# Patient Record
Sex: Male | Born: 1975 | Race: White | Hispanic: No | Marital: Single | Smoking: Current every day smoker
Health system: Southern US, Community
[De-identification: ages and names within clinical notes are randomized; demographics above are authoritative.]

## PROBLEM LIST (undated history)

## (undated) HISTORY — PX: WRIST FRACTURE SURGERY: SHX121

---

## 2001-12-03 ENCOUNTER — Emergency Department (HOSPITAL_COMMUNITY): Admission: EM | Admit: 2001-12-03 | Discharge: 2001-12-03 | Payer: Self-pay | Admitting: *Deleted

## 2003-09-29 ENCOUNTER — Emergency Department (HOSPITAL_COMMUNITY): Admission: EM | Admit: 2003-09-29 | Discharge: 2003-09-29 | Payer: Self-pay | Admitting: Emergency Medicine

## 2005-06-16 ENCOUNTER — Ambulatory Visit (HOSPITAL_COMMUNITY): Admission: RE | Admit: 2005-06-16 | Discharge: 2005-06-16 | Payer: Self-pay | Admitting: Family Medicine

## 2005-11-15 ENCOUNTER — Ambulatory Visit (HOSPITAL_COMMUNITY): Admission: RE | Admit: 2005-11-15 | Discharge: 2005-11-15 | Payer: Self-pay | Admitting: Family Medicine

## 2012-08-21 ENCOUNTER — Other Ambulatory Visit (HOSPITAL_COMMUNITY): Payer: Self-pay | Admitting: Internal Medicine

## 2012-08-21 ENCOUNTER — Ambulatory Visit (HOSPITAL_COMMUNITY)
Admission: RE | Admit: 2012-08-21 | Discharge: 2012-08-21 | Disposition: A | Discharge status: Home / Self Care | Payer: BC Managed Care – PPO | Source: Ambulatory Visit | Attending: Internal Medicine | Admitting: Internal Medicine

## 2012-08-21 DIAGNOSIS — M25579 Pain in unspecified ankle and joints of unspecified foot: Secondary | ICD-10-CM | POA: Insufficient documentation

## 2012-08-21 DIAGNOSIS — M25571 Pain in right ankle and joints of right foot: Secondary | ICD-10-CM

## 2012-08-30 ENCOUNTER — Ambulatory Visit (HOSPITAL_COMMUNITY)
Admission: RE | Admit: 2012-08-30 | Discharge: 2012-08-30 | Disposition: A | Discharge status: Home / Self Care | Payer: BC Managed Care – PPO | Source: Ambulatory Visit | Attending: Internal Medicine | Admitting: Internal Medicine

## 2012-08-30 ENCOUNTER — Other Ambulatory Visit (HOSPITAL_COMMUNITY): Payer: Self-pay | Admitting: Internal Medicine

## 2012-08-30 DIAGNOSIS — R209 Unspecified disturbances of skin sensation: Secondary | ICD-10-CM | POA: Insufficient documentation

## 2012-08-30 DIAGNOSIS — R0789 Other chest pain: Secondary | ICD-10-CM

## 2012-08-30 DIAGNOSIS — R222 Localized swelling, mass and lump, trunk: Secondary | ICD-10-CM | POA: Insufficient documentation

## 2017-06-10 ENCOUNTER — Other Ambulatory Visit: Payer: Self-pay

## 2017-06-10 ENCOUNTER — Emergency Department (HOSPITAL_COMMUNITY)
Admission: EM | Admit: 2017-06-10 | Discharge: 2017-06-11 | Disposition: A | Discharge status: Home / Self Care | Payer: Managed Care, Other (non HMO) | Attending: Emergency Medicine | Admitting: Emergency Medicine

## 2017-06-10 ENCOUNTER — Encounter (HOSPITAL_COMMUNITY): Payer: Self-pay | Admitting: Emergency Medicine

## 2017-06-10 DIAGNOSIS — N201 Calculus of ureter: Secondary | ICD-10-CM | POA: Diagnosis not present

## 2017-06-10 DIAGNOSIS — F17210 Nicotine dependence, cigarettes, uncomplicated: Secondary | ICD-10-CM | POA: Diagnosis not present

## 2017-06-10 DIAGNOSIS — N200 Calculus of kidney: Secondary | ICD-10-CM

## 2017-06-10 DIAGNOSIS — N23 Unspecified renal colic: Secondary | ICD-10-CM

## 2017-06-10 DIAGNOSIS — R112 Nausea with vomiting, unspecified: Secondary | ICD-10-CM | POA: Insufficient documentation

## 2017-06-10 DIAGNOSIS — R1032 Left lower quadrant pain: Secondary | ICD-10-CM | POA: Diagnosis present

## 2017-06-10 LAB — URINALYSIS, ROUTINE W REFLEX MICROSCOPIC
BILIRUBIN URINE: NEGATIVE
Bacteria, UA: NONE SEEN
GLUCOSE, UA: NEGATIVE mg/dL
KETONES UR: 20 mg/dL — AB
LEUKOCYTES UA: NEGATIVE
NITRITE: NEGATIVE
PH: 8 (ref 5.0–8.0)
Protein, ur: 30 mg/dL — AB
SQUAMOUS EPITHELIAL / LPF: NONE SEEN
Specific Gravity, Urine: 1.019 (ref 1.005–1.030)

## 2017-06-10 LAB — COMPREHENSIVE METABOLIC PANEL
ALT: 16 U/L — AB (ref 17–63)
AST: 20 U/L (ref 15–41)
Albumin: 4.2 g/dL (ref 3.5–5.0)
Alkaline Phosphatase: 76 U/L (ref 38–126)
Anion gap: 11 (ref 5–15)
BUN: 10 mg/dL (ref 6–20)
CHLORIDE: 100 mmol/L — AB (ref 101–111)
CO2: 26 mmol/L (ref 22–32)
CREATININE: 1.18 mg/dL (ref 0.61–1.24)
Calcium: 9.6 mg/dL (ref 8.9–10.3)
Glucose, Bld: 119 mg/dL — ABNORMAL HIGH (ref 65–99)
Potassium: 3.3 mmol/L — ABNORMAL LOW (ref 3.5–5.1)
Sodium: 137 mmol/L (ref 135–145)
Total Bilirubin: 0.8 mg/dL (ref 0.3–1.2)
Total Protein: 7.8 g/dL (ref 6.5–8.1)

## 2017-06-10 LAB — CBC
HCT: 46.8 % (ref 39.0–52.0)
Hemoglobin: 15.5 g/dL (ref 13.0–17.0)
MCH: 31 pg (ref 26.0–34.0)
MCHC: 33.1 g/dL (ref 30.0–36.0)
MCV: 93.6 fL (ref 78.0–100.0)
Platelets: 226 10*3/uL (ref 150–400)
RBC: 5 MIL/uL (ref 4.22–5.81)
RDW: 12.4 % (ref 11.5–15.5)
WBC: 16.1 10*3/uL — AB (ref 4.0–10.5)

## 2017-06-10 LAB — LIPASE, BLOOD: LIPASE: 26 U/L (ref 11–51)

## 2017-06-10 MED ORDER — ONDANSETRON 4 MG PO TBDP
4.0000 mg | ORAL_TABLET | Freq: Once | ORAL | Status: AC | PRN
  Administered 2017-06-10: 4 mg via ORAL
  Filled 2017-06-10: qty 1

## 2017-06-10 NOTE — ED Triage Notes (Addendum)
PT c/o left flank pain with nausea and vomiting for the past 2 days with chills/sweats. PT also states his daughter had same symptoms this week and was dx with stomach virus.

## 2017-06-11 ENCOUNTER — Emergency Department (HOSPITAL_COMMUNITY): Payer: Managed Care, Other (non HMO)

## 2017-06-11 MED ORDER — MORPHINE SULFATE (PF) 4 MG/ML IV SOLN
4.0000 mg | Freq: Once | INTRAVENOUS | Status: AC
  Administered 2017-06-11: 4 mg via INTRAVENOUS
  Filled 2017-06-11: qty 1

## 2017-06-11 MED ORDER — ONDANSETRON HCL 4 MG/2ML IJ SOLN
4.0000 mg | Freq: Once | INTRAMUSCULAR | Status: AC
  Administered 2017-06-11: 4 mg via INTRAVENOUS
  Filled 2017-06-11: qty 2

## 2017-06-11 MED ORDER — ONDANSETRON HCL 4 MG PO TABS: 4.0000 mg | ORAL_TABLET | Freq: Three times a day (TID) | ORAL | 0 refills | Status: AC | PRN

## 2017-06-11 MED ORDER — KETOROLAC TROMETHAMINE 30 MG/ML IJ SOLN
30.0000 mg | Freq: Once | INTRAMUSCULAR | Status: AC
  Administered 2017-06-11: 30 mg via INTRAVENOUS
  Filled 2017-06-11: qty 1

## 2017-06-11 MED ORDER — SODIUM CHLORIDE 0.9 % IV BOLUS (SEPSIS)
1000.0000 mL | Freq: Once | INTRAVENOUS | Status: AC
Start: 2017-06-11 — End: 2017-06-11
  Administered 2017-06-11: 1000 mL via INTRAVENOUS

## 2017-06-11 MED ORDER — TAMSULOSIN HCL 0.4 MG PO CAPS: 0.4000 mg | ORAL_CAPSULE | Freq: Every day | ORAL | 0 refills | Status: AC

## 2017-06-11 MED ORDER — OXYCODONE-ACETAMINOPHEN 5-325 MG PO TABS: 1.0000 | ORAL_TABLET | ORAL | 0 refills | Status: AC | PRN

## 2017-06-11 MED ORDER — ONDANSETRON HCL 4 MG PO TABS: 4.0000 mg | ORAL_TABLET | Freq: Four times a day (QID) | ORAL | 0 refills | Status: AC | PRN

## 2017-06-11 NOTE — ED Provider Notes (Signed)
[facility] EMERGENCY DEPARTMENT Provider Note   CSN: [id number] Arrival date & time: 06/10/17  1810     History   Chief Complaint Chief Complaint  Patient presents with  . Flank Pain    HPI [place] E Mathurin is a 42 y.o. male.  The history is provided by the patient.  He comes in with sharp pain in the left flank radiating to the left lower abdomen which started 2 days ago.  Pain is severe and he rates it at 10/10.  Nothing makes it better, nothing makes it worse.  There is associated nausea and vomiting.  He has had some chills and sweats but no fever.  He denies any urinary difficulty and denies any constipation or diarrhea.  He has not taken anything for this at home.  He has never had similar symptoms before.  History reviewed. No pertinent past medical history.  There are no active problems to display for this patient.   Past Surgical History:  Procedure Laterality Date  . WRIST FRACTURE SURGERY         Home Medications    Prior to Admission medications   Not on File    Family History History reviewed. No pertinent family history.  Social History Social History   Tobacco Use  . Smoking status: Current Every Day Smoker    Packs/day: 2.00    Types: Cigarettes  . Smokeless tobacco: Never Used  Substance Use Topics  . Alcohol use: No    Frequency: Never  . Drug use: No     Allergies   Patient has no known allergies.   Review of Systems Review of Systems  All other systems reviewed and are negative.    Physical Exam Updated Vital Signs BP 110/76 (BP Location: Right Arm)   Pulse 82   Temp 97.9 F (36.6 C)   Resp 20   Ht 6' (1.829 m)   Wt 68 kg (150 lb)   SpO2 100%   BMI 20.34 kg/m   Physical Exam  Nursing note and vitals reviewed.  42 year old male, appears very uncomfortable and in pain, but is in no acute distress. Vital signs are normal. Oxygen saturation is 100%, which is normal. Head is normocephalic and atraumatic. PERRLA, EOMI.  Oropharynx is clear. Neck is nontender and supple without adenopathy or JVD. Back is nontender in the midline.  There is mild left CVA tenderness. Lungs are clear without rales, wheezes, or rhonchi. Chest is nontender. Heart has regular rate and rhythm without murmur. Abdomen is soft, flat, with mild left lower quadrant tenderness.  There is no rebound or guarding.  There are no masses or hepatosplenomegaly and peristalsis is hypoactive. Extremities have no cyanosis or edema, full range of motion is present. Skin is warm and dry without rash. Neurologic: Mental status is normal, cranial nerves are intact, there are no motor or sensory deficits.  ED Treatments / Results  Labs (all labs ordered are listed, but only abnormal results are displayed) Labs Reviewed  COMPREHENSIVE METABOLIC PANEL - Abnormal; Notable for the following components:      Result Value   Potassium 3.3 (*)    Chloride 100 (*)    Glucose, Bld 119 (*)    ALT 16 (*)    All other components within normal limits  CBC - Abnormal; Notable for the following components:   WBC 16.1 (*)    All other components within normal limits  URINALYSIS, ROUTINE W REFLEX MICROSCOPIC - Abnormal; Notable for the  following components:   APPearance HAZY (*)    Hgb urine dipstick MODERATE (*)    Ketones, ur 20 (*)    Protein, ur 30 (*)    All other components within normal limits  LIPASE, BLOOD   Radiology Ct Renal Stone Study  Result Date: 06/11/2017 CLINICAL DATA:  42 y/o M; sharp intermittent left flank pain with nausea and vomiting. EXAM: CT ABDOMEN AND PELVIS WITHOUT CONTRAST TECHNIQUE: Multidetector CT imaging of the abdomen and pelvis was performed following the standard protocol without IV contrast. COMPARISON:  09/29/2003 CT of abdomen and pelvis FINDINGS: Lower chest: Bilateral subpleural 4-5 mm nodules, likely intrapulmonary lymph nodes. Hepatobiliary: No focal liver abnormality is seen. No gallstones, gallbladder wall  thickening, or biliary dilatation. Pancreas: Unremarkable. No pancreatic ductal dilatation or surrounding inflammatory changes. Spleen: Normal in size without focal abnormality. Adrenals/Urinary Tract: Normal adrenal glands. Punctate left kidney interpolar and 4 mm right kidney interpolar nonobstructing stones. No focal kidney lesion identified. No right hydronephrosis. Mild left hydronephrosis with 1 mm stones in distal left ureter just proximal to the ureterovesicular junction. Collapsed bladder. Stomach/Bowel: Stomach is within normal limits. Appendix appears normal. No evidence of bowel wall thickening, distention, or inflammatory changes. Vascular/Lymphatic: No significant vascular findings are present. No enlarged abdominal or pelvic lymph nodes. Reproductive: Central prostatic calcifications. Other: No abdominal wall hernia or abnormality. No abdominopelvic ascites. Musculoskeletal: No acute or significant osseous findings. IMPRESSION: Mild left hydronephrosis with 4 mm stone in distal left ureter just proximal to the ureterovesicular junction. Electronically Signed   By: Mitzi Hansen M.D.   On: 06/11/2017 02:19    Procedures Procedures (including critical care time)  Medications Ordered in ED Medications  sodium chloride 0.9 % bolus 1,000 mL (not administered)  ketorolac (TORADOL) 30 MG/ML injection 30 mg (not administered)  morphine 4 MG/ML injection 4 mg (not administered)  ondansetron (ZOFRAN) injection 4 mg (not administered)  ondansetron (ZOFRAN-ODT) disintegrating tablet 4 mg (4 mg Oral Given 06/10/17 1830)     Initial Impression / Assessment and Plan / ED Course  I have reviewed the triage vital signs and the nursing notes.  Pertinent labs & imaging results that were available during my care of the patient were reviewed by me and considered in my medical decision making (see chart for details).  Severe left flank pain strongly suggestive of ureterolithiasis.  Consider  diverticulitis, urinary tract infection.  Urinalysis does show too numerous to count RBCs, consistent with urolithiasis.  Doubt abdominal aneurysm given lack of significant history.  He will be given IV fluids, ketorolac, morphine, ondansetron and will be sent for CT renal stone protocol.  Old records are reviewed, and he did have a negative CT of abdomen and pelvis in 2005.  CT scan shows a 4 mm distal ureteral calculus.  He had excellent relief of symptoms with above-noted treatment.  He is discharged with prescriptions for oxycodone-acetaminophen, ondansetron, tamsulosin.  Advised to use ibuprofen or naproxen as needed.  Referred to urology for follow-up.  Return precautions discussed.  Final Clinical Impressions(s) / ED Diagnoses   Final diagnoses:  Ureterolithiasis  Ureteral colic    ED Discharge Orders        Ordered    oxyCODONE-acetaminophen (PERCOCET) 5-325 MG tablet  Every 4 hours PRN     06/11/17 0305    ondansetron (ZOFRAN) 4 MG tablet  Every 8 hours PRN     06/11/17 0305    oxyCODONE-acetaminophen (PERCOCET) 5-325 MG tablet  Every 4 hours PRN  06/11/17 0306    ondansetron (ZOFRAN) 4 MG tablet  Every 6 hours PRN     06/11/17 0306    tamsulosin (FLOMAX) 0.4 MG CAPS capsule  Daily     06/11/17 0306       Dione BoozeGlick, Sharanya Templin, MD 06/11/17 [phone]

## 2017-06-11 NOTE — ED Notes (Signed)
Family irate that no doctor has seen pt yet and that we had to move pt in hallway d/t emergency pt that was coming in via EMS.  Explained delay to pt and family but they remain very unhappy. Notified house Supervisor Gina,RN of this and asked family to speak with pt and family.

## 2017-06-11 NOTE — Discharge Instructions (Signed)
Drink plenty of fluids.  Return if pain is not being adequately controlled, or if you start running a fever. 

## 2017-06-12 MED FILL — Oxycodone w/ Acetaminophen Tab 5-325 MG: ORAL | Qty: 6 | Status: AC

## 2017-06-12 MED FILL — Ondansetron HCl Tab 4 MG: ORAL | Qty: 4 | Status: AC

## 2019-04-24 ENCOUNTER — Other Ambulatory Visit: Payer: Self-pay

## 2019-04-24 DIAGNOSIS — Z20822 Contact with and (suspected) exposure to covid-19: Secondary | ICD-10-CM

## 2019-04-27 LAB — NOVEL CORONAVIRUS, NAA: SARS-CoV-2, NAA: NOT DETECTED

## 2019-04-28 ENCOUNTER — Telehealth: Payer: Self-pay

## 2019-04-28 NOTE — Telephone Encounter (Signed)
Patient given negative result and verbalized understanding  

## 2019-09-25 IMAGING — CT CT RENAL STONE PROTOCOL
2 of 4 series · 16 of 46 positions shown, 18 images · non-contrast
Comparison: 09/29/2003 CT of abdomen and pelvis

CLINICAL DATA: 41 y/o M; sharp intermittent left flank pain with
nausea and vomiting.

EXAM:
CT ABDOMEN AND PELVIS WITHOUT CONTRAST
TECHNIQUE: Multidetector CT imaging of the abdomen and pelvis was performed
following the standard protocol without IV contrast.

[Series 2: axial st · axial · 0.64mm/px · z∈[-393,+47]mm · 13 of 96 slices shown, 15 images]
[im 4/96  soft-tissue]
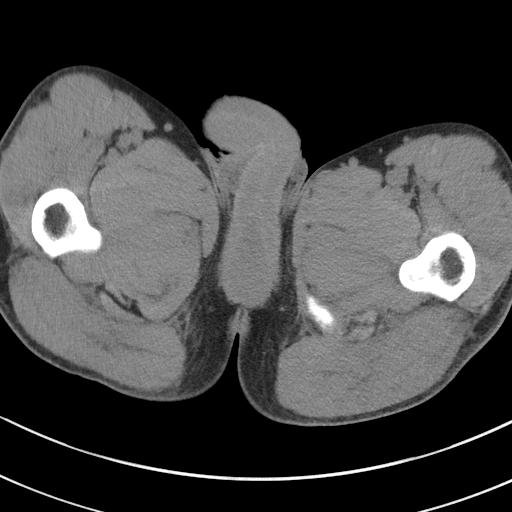
[im 4/96  bone]
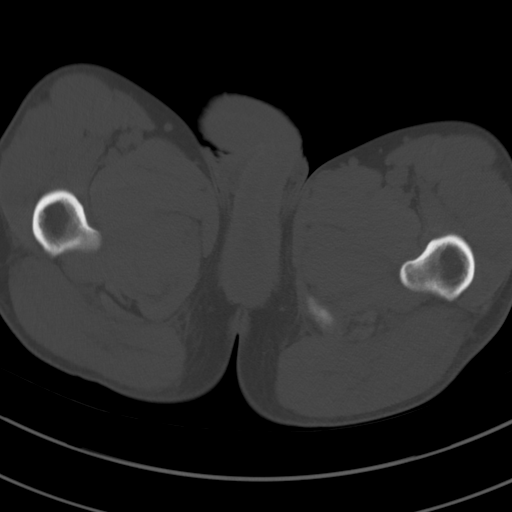
[im 12/96  soft-tissue]
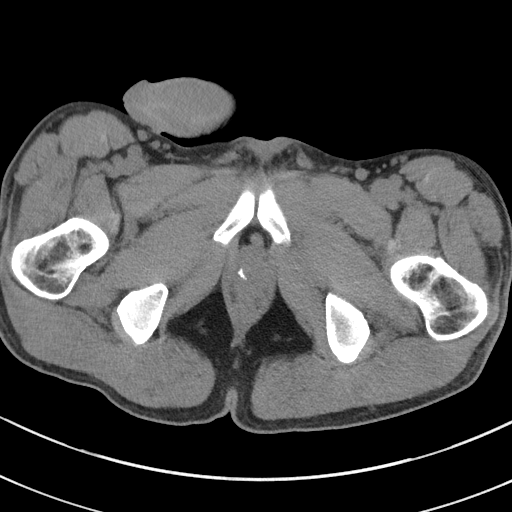
[im 20/96  soft-tissue]
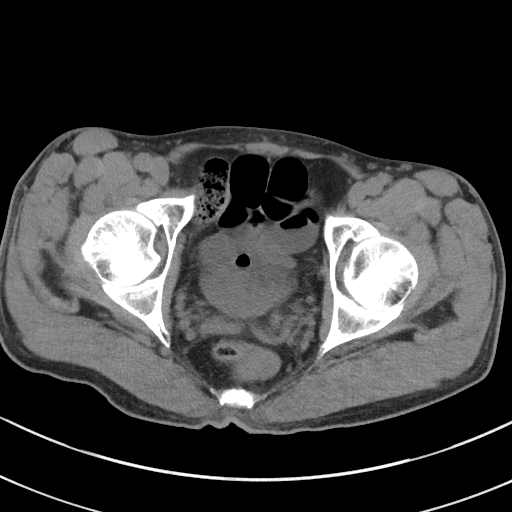
[im 27/96  soft-tissue]
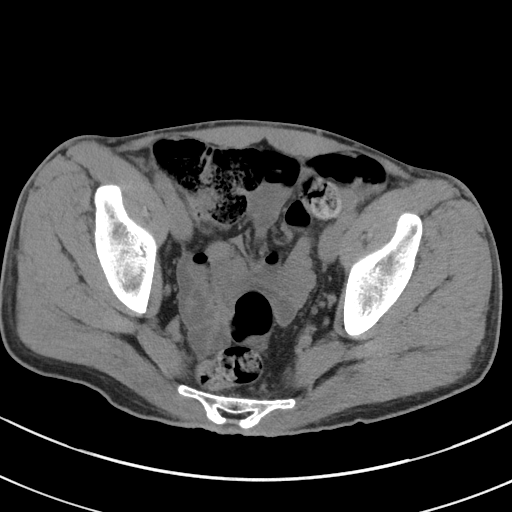
[im 35/96  soft-tissue]
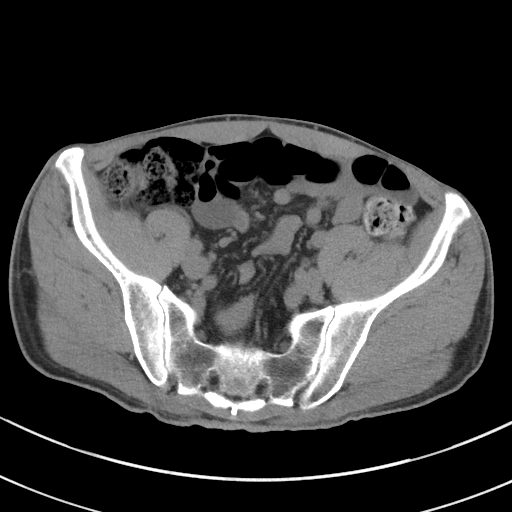
[im 42/96  soft-tissue]
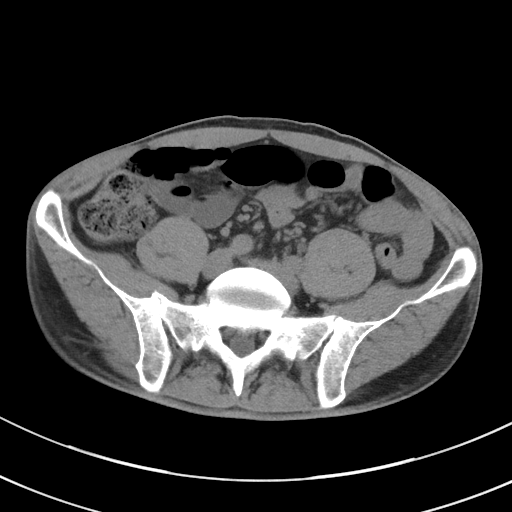
[im 50/96  soft-tissue]
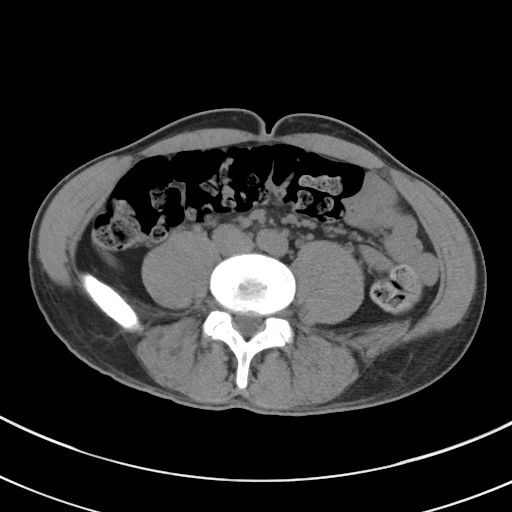
[im 54/96  soft-tissue]
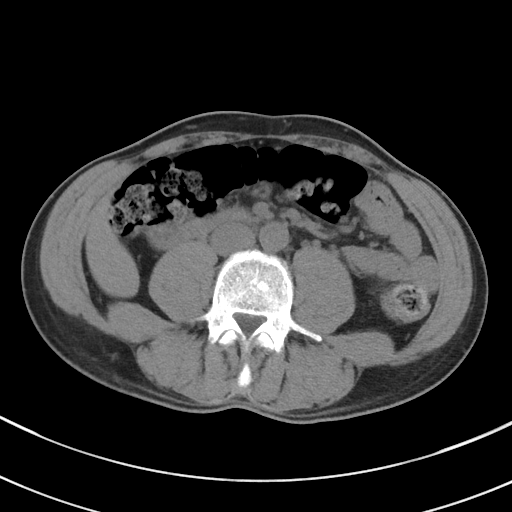
[im 61/96  soft-tissue]
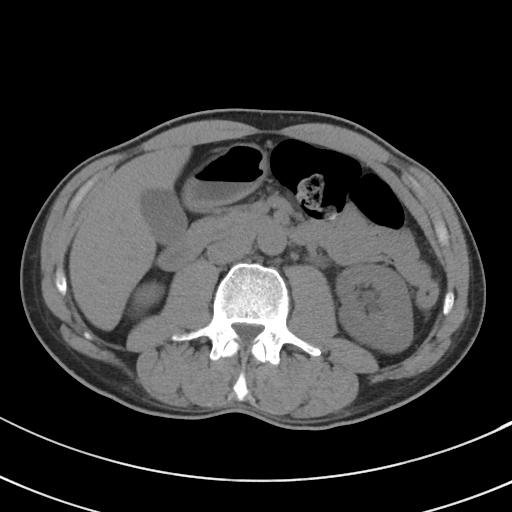
[im 61/96  bone]
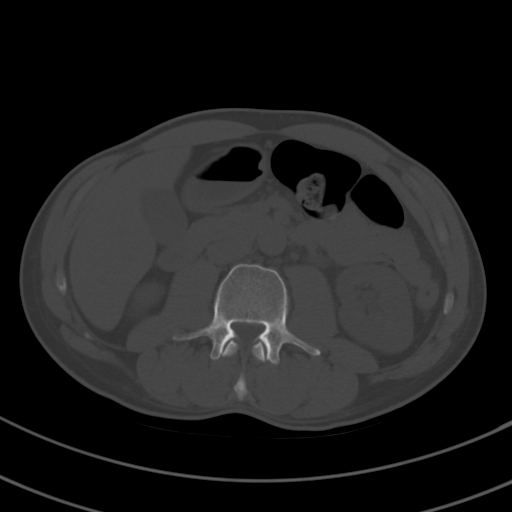
[im 69/96  soft-tissue]
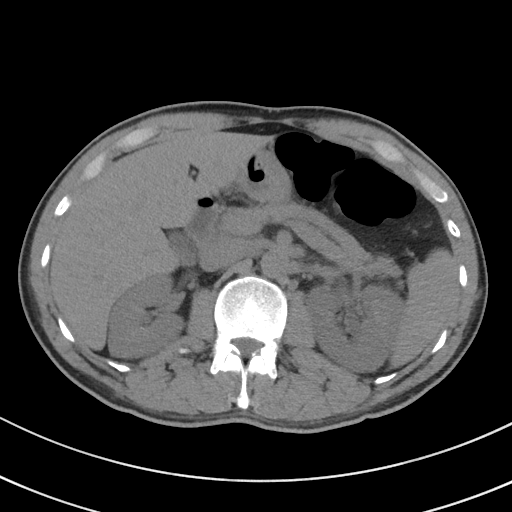
[im 77/96  soft-tissue]
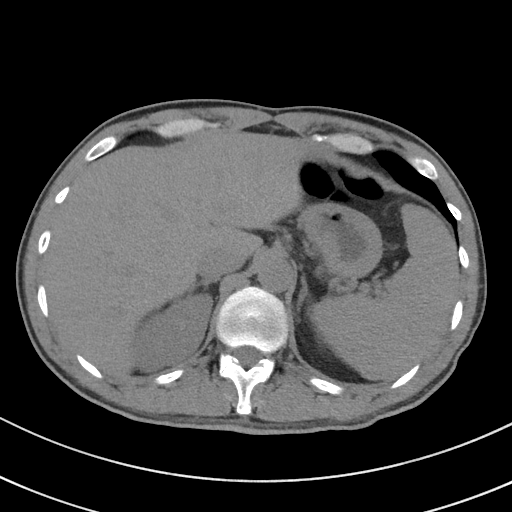
[im 84/96  soft-tissue]
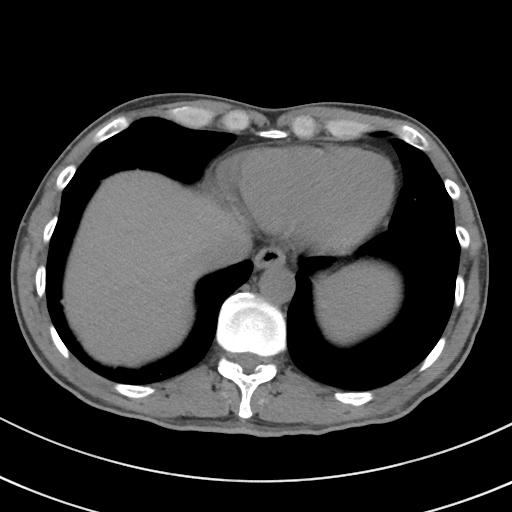
[im 92/96  soft-tissue]
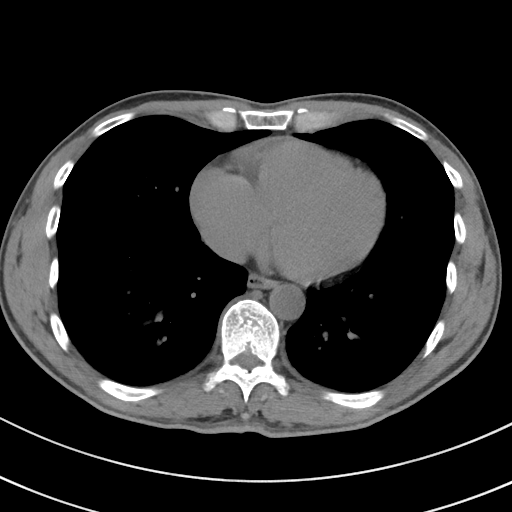

[Series 5: coronal st · coronal · 0.73mm/px · 3 of 91 slices shown]
[im 31/91  soft-tissue]
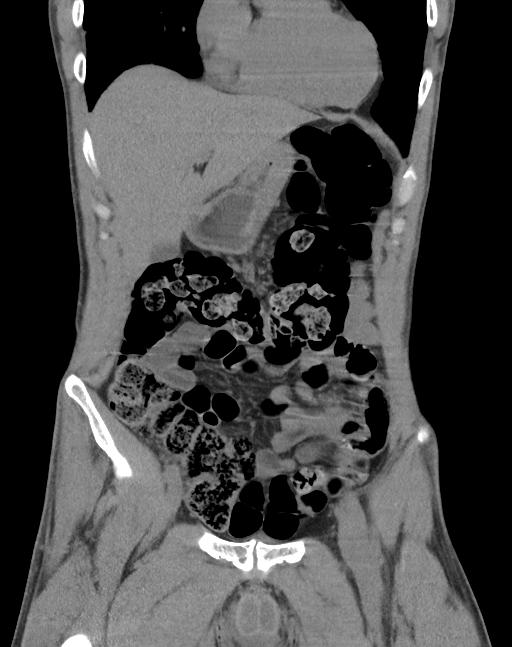
[im 41/91  soft-tissue]
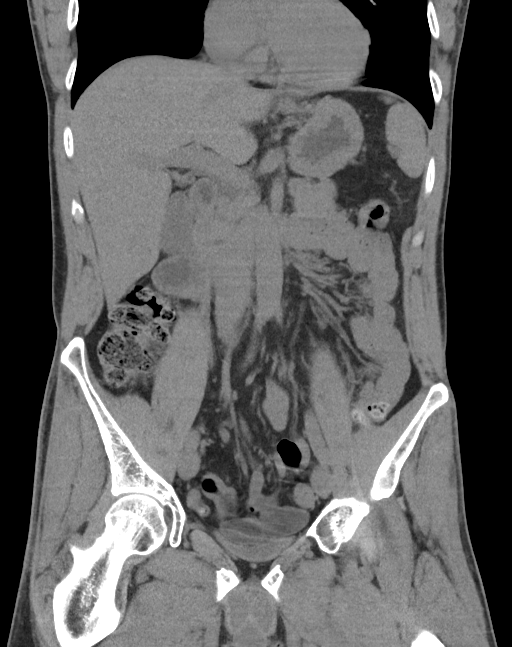
[im 51/91  soft-tissue]
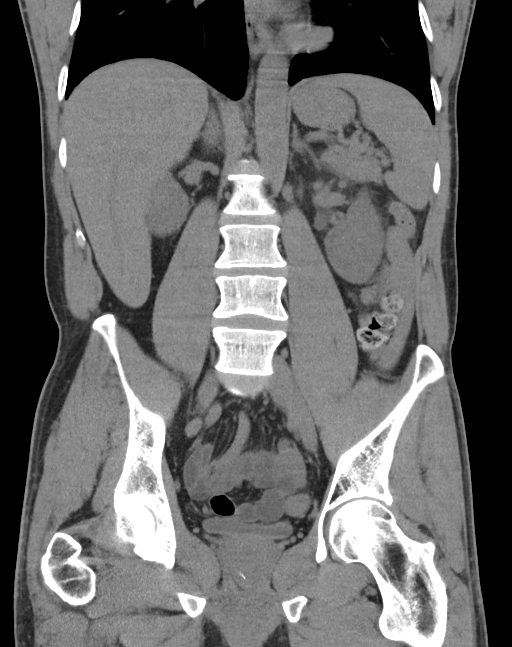

[16 of 46 positions shown; findings below may reference images not displayed]

FINDINGS: Lower chest: Bilateral subpleural 4-5 mm nodules, likely
intrapulmonary lymph nodes.

Hepatobiliary: No focal liver abnormality is seen. No gallstones,
gallbladder wall thickening, or biliary dilatation.

Pancreas: Unremarkable. No pancreatic ductal dilatation or
surrounding inflammatory changes.

Spleen: Normal in size without focal abnormality.

Adrenals/Urinary Tract: Normal adrenal glands. Punctate left kidney
interpolar and 4 mm right kidney interpolar nonobstructing stones.
No focal kidney lesion identified. No right hydronephrosis. Mild
left hydronephrosis with 1 mm stones in distal left ureter just
proximal to the ureterovesicular junction. Collapsed bladder.

Stomach/Bowel: Stomach is within normal limits. Appendix appears
normal. No evidence of bowel wall thickening, distention, or
inflammatory changes.

Vascular/Lymphatic: No significant vascular findings are present. No
enlarged abdominal or pelvic lymph nodes.

Reproductive: Central prostatic calcifications.

Other: No abdominal wall hernia or abnormality. No abdominopelvic
ascites.

Musculoskeletal: No acute or significant osseous findings.
IMPRESSION: Mild left hydronephrosis with 4 mm stone in distal left ureter just
proximal to the ureterovesicular junction.

By: Maria R Kiki M.D.

## 2019-11-24 DIAGNOSIS — Z6820 Body mass index (BMI) 20.0-20.9, adult: Secondary | ICD-10-CM | POA: Diagnosis not present

## 2019-11-24 DIAGNOSIS — R55 Syncope and collapse: Secondary | ICD-10-CM | POA: Diagnosis not present

## 2019-11-24 DIAGNOSIS — R519 Headache, unspecified: Secondary | ICD-10-CM | POA: Diagnosis not present

## 2019-12-18 ENCOUNTER — Telehealth: Payer: Self-pay

## 2019-12-18 NOTE — Telephone Encounter (Signed)
NOTES ON FILE FROM [facility] [phone], SENT REFERRAL TO SCHEDULING 

## 2020-01-07 ENCOUNTER — Other Ambulatory Visit: Payer: Self-pay | Admitting: *Deleted

## 2020-01-07 DIAGNOSIS — R55 Syncope and collapse: Secondary | ICD-10-CM

## 2021-06-14 DIAGNOSIS — E663 Overweight: Secondary | ICD-10-CM | POA: Diagnosis not present

## 2021-06-14 DIAGNOSIS — E782 Mixed hyperlipidemia: Secondary | ICD-10-CM | POA: Diagnosis not present

## 2021-06-14 DIAGNOSIS — L711 Rhinophyma: Secondary | ICD-10-CM | POA: Diagnosis not present

## 2021-06-14 DIAGNOSIS — Z0000 Encounter for general adult medical examination without abnormal findings: Secondary | ICD-10-CM | POA: Diagnosis not present

## 2021-06-14 DIAGNOSIS — Z1331 Encounter for screening for depression: Secondary | ICD-10-CM | POA: Diagnosis not present

## 2021-06-14 DIAGNOSIS — R519 Headache, unspecified: Secondary | ICD-10-CM | POA: Diagnosis not present

## 2021-06-14 DIAGNOSIS — Z6822 Body mass index (BMI) 22.0-22.9, adult: Secondary | ICD-10-CM | POA: Diagnosis not present

## 2021-08-16 DIAGNOSIS — R112 Nausea with vomiting, unspecified: Secondary | ICD-10-CM | POA: Diagnosis not present

## 2021-08-16 DIAGNOSIS — A084 Viral intestinal infection, unspecified: Secondary | ICD-10-CM | POA: Diagnosis not present

## 2021-08-22 DIAGNOSIS — Z6821 Body mass index (BMI) 21.0-21.9, adult: Secondary | ICD-10-CM | POA: Diagnosis not present

## 2021-08-22 DIAGNOSIS — K529 Noninfective gastroenteritis and colitis, unspecified: Secondary | ICD-10-CM | POA: Diagnosis not present

## 2022-10-20 DIAGNOSIS — R079 Chest pain, unspecified: Secondary | ICD-10-CM | POA: Diagnosis not present

## 2022-10-20 DIAGNOSIS — F411 Generalized anxiety disorder: Secondary | ICD-10-CM | POA: Diagnosis not present

## 2022-10-20 DIAGNOSIS — N200 Calculus of kidney: Secondary | ICD-10-CM | POA: Diagnosis not present

## 2022-10-20 DIAGNOSIS — R109 Unspecified abdominal pain: Secondary | ICD-10-CM | POA: Diagnosis not present

## 2022-12-20 DIAGNOSIS — R109 Unspecified abdominal pain: Secondary | ICD-10-CM | POA: Diagnosis not present

## 2022-12-20 DIAGNOSIS — N200 Calculus of kidney: Secondary | ICD-10-CM | POA: Diagnosis not present

## 2022-12-20 DIAGNOSIS — R079 Chest pain, unspecified: Secondary | ICD-10-CM | POA: Diagnosis not present

## 2022-12-20 DIAGNOSIS — F411 Generalized anxiety disorder: Secondary | ICD-10-CM | POA: Diagnosis not present

## 2022-12-28 DIAGNOSIS — R161 Splenomegaly, not elsewhere classified: Secondary | ICD-10-CM | POA: Diagnosis not present

## 2022-12-28 DIAGNOSIS — R109 Unspecified abdominal pain: Secondary | ICD-10-CM | POA: Diagnosis not present

## 2022-12-28 DIAGNOSIS — N200 Calculus of kidney: Secondary | ICD-10-CM | POA: Diagnosis not present

## 2023-01-01 DIAGNOSIS — N200 Calculus of kidney: Secondary | ICD-10-CM | POA: Diagnosis not present

## 2023-01-01 DIAGNOSIS — R109 Unspecified abdominal pain: Secondary | ICD-10-CM | POA: Diagnosis not present

## 2023-01-08 DIAGNOSIS — Z87442 Personal history of urinary calculi: Secondary | ICD-10-CM | POA: Diagnosis not present

## 2023-01-08 DIAGNOSIS — R001 Bradycardia, unspecified: Secondary | ICD-10-CM | POA: Diagnosis not present

## 2023-01-08 DIAGNOSIS — R1032 Left lower quadrant pain: Secondary | ICD-10-CM | POA: Diagnosis not present

## 2023-01-08 DIAGNOSIS — Z87891 Personal history of nicotine dependence: Secondary | ICD-10-CM | POA: Diagnosis not present

## 2023-01-08 DIAGNOSIS — R319 Hematuria, unspecified: Secondary | ICD-10-CM | POA: Diagnosis not present

## 2023-01-08 DIAGNOSIS — R109 Unspecified abdominal pain: Secondary | ICD-10-CM | POA: Diagnosis not present

## 2023-01-08 DIAGNOSIS — N200 Calculus of kidney: Secondary | ICD-10-CM | POA: Diagnosis not present

## 2023-01-15 DIAGNOSIS — N200 Calculus of kidney: Secondary | ICD-10-CM | POA: Diagnosis not present

## 2023-01-15 DIAGNOSIS — R109 Unspecified abdominal pain: Secondary | ICD-10-CM | POA: Diagnosis not present

## 2023-01-23 DIAGNOSIS — N200 Calculus of kidney: Secondary | ICD-10-CM | POA: Diagnosis not present

## 2023-01-27 DIAGNOSIS — N23 Unspecified renal colic: Secondary | ICD-10-CM | POA: Diagnosis not present

## 2023-01-27 DIAGNOSIS — F17290 Nicotine dependence, other tobacco product, uncomplicated: Secondary | ICD-10-CM | POA: Diagnosis not present

## 2023-01-27 DIAGNOSIS — Z9889 Other specified postprocedural states: Secondary | ICD-10-CM | POA: Diagnosis not present

## 2023-01-27 DIAGNOSIS — F419 Anxiety disorder, unspecified: Secondary | ICD-10-CM | POA: Diagnosis not present

## 2023-01-27 DIAGNOSIS — N201 Calculus of ureter: Secondary | ICD-10-CM | POA: Diagnosis not present

## 2023-01-27 DIAGNOSIS — N399 Disorder of urinary system, unspecified: Secondary | ICD-10-CM | POA: Diagnosis not present

## 2023-01-27 DIAGNOSIS — Z87891 Personal history of nicotine dependence: Secondary | ICD-10-CM | POA: Diagnosis not present

## 2023-01-27 DIAGNOSIS — N132 Hydronephrosis with renal and ureteral calculous obstruction: Secondary | ICD-10-CM | POA: Diagnosis not present

## 2023-01-27 DIAGNOSIS — Z87442 Personal history of urinary calculi: Secondary | ICD-10-CM | POA: Diagnosis not present

## 2023-01-27 DIAGNOSIS — Z466 Encounter for fitting and adjustment of urinary device: Secondary | ICD-10-CM | POA: Diagnosis not present

## 2023-01-27 DIAGNOSIS — N390 Urinary tract infection, site not specified: Secondary | ICD-10-CM | POA: Diagnosis not present

## 2023-01-27 DIAGNOSIS — R310 Gross hematuria: Secondary | ICD-10-CM | POA: Diagnosis not present

## 2023-01-27 DIAGNOSIS — N136 Pyonephrosis: Secondary | ICD-10-CM | POA: Diagnosis not present

## 2023-01-27 DIAGNOSIS — N179 Acute kidney failure, unspecified: Secondary | ICD-10-CM | POA: Diagnosis not present

## 2023-01-27 DIAGNOSIS — N202 Calculus of kidney with calculus of ureter: Secondary | ICD-10-CM | POA: Diagnosis not present

## 2023-01-28 DIAGNOSIS — N132 Hydronephrosis with renal and ureteral calculous obstruction: Secondary | ICD-10-CM | POA: Diagnosis not present

## 2023-01-28 DIAGNOSIS — N390 Urinary tract infection, site not specified: Secondary | ICD-10-CM | POA: Diagnosis not present

## 2023-01-28 DIAGNOSIS — Z466 Encounter for fitting and adjustment of urinary device: Secondary | ICD-10-CM | POA: Diagnosis not present

## 2023-01-29 DIAGNOSIS — R310 Gross hematuria: Secondary | ICD-10-CM | POA: Diagnosis not present

## 2023-01-29 DIAGNOSIS — N179 Acute kidney failure, unspecified: Secondary | ICD-10-CM | POA: Diagnosis not present

## 2023-01-29 DIAGNOSIS — N390 Urinary tract infection, site not specified: Secondary | ICD-10-CM | POA: Diagnosis not present

## 2023-01-29 DIAGNOSIS — N132 Hydronephrosis with renal and ureteral calculous obstruction: Secondary | ICD-10-CM | POA: Diagnosis not present

## 2023-01-29 DIAGNOSIS — N201 Calculus of ureter: Secondary | ICD-10-CM | POA: Diagnosis not present

## 2023-02-02 DIAGNOSIS — N200 Calculus of kidney: Secondary | ICD-10-CM | POA: Diagnosis not present

## 2023-02-02 DIAGNOSIS — R109 Unspecified abdominal pain: Secondary | ICD-10-CM | POA: Diagnosis not present

## 2023-02-20 DIAGNOSIS — N200 Calculus of kidney: Secondary | ICD-10-CM | POA: Diagnosis not present

## 2023-02-20 DIAGNOSIS — Z79899 Other long term (current) drug therapy: Secondary | ICD-10-CM | POA: Diagnosis not present

## 2023-02-20 DIAGNOSIS — T8389XA Other specified complication of genitourinary prosthetic devices, implants and grafts, initial encounter: Secondary | ICD-10-CM | POA: Diagnosis not present

## 2023-03-09 DIAGNOSIS — R109 Unspecified abdominal pain: Secondary | ICD-10-CM | POA: Diagnosis not present

## 2023-03-09 DIAGNOSIS — R1032 Left lower quadrant pain: Secondary | ICD-10-CM | POA: Diagnosis not present

## 2023-03-09 DIAGNOSIS — N200 Calculus of kidney: Secondary | ICD-10-CM | POA: Diagnosis not present

## 2023-03-09 DIAGNOSIS — N132 Hydronephrosis with renal and ureteral calculous obstruction: Secondary | ICD-10-CM | POA: Diagnosis not present

## 2023-03-09 DIAGNOSIS — R112 Nausea with vomiting, unspecified: Secondary | ICD-10-CM | POA: Diagnosis not present

## 2023-03-09 DIAGNOSIS — R1012 Left upper quadrant pain: Secondary | ICD-10-CM | POA: Diagnosis not present

## 2023-03-09 DIAGNOSIS — E876 Hypokalemia: Secondary | ICD-10-CM | POA: Diagnosis not present
